# Patient Record
Sex: Male | Born: 1970 | Hispanic: No | Marital: Single | Smoking: Current every day smoker
Health system: Southern US, Community
[De-identification: ages and names within clinical notes are randomized; demographics above are authoritative.]

## PROBLEM LIST (undated history)

## (undated) DIAGNOSIS — S41039A Puncture wound without foreign body of unspecified shoulder, initial encounter: Secondary | ICD-10-CM

## (undated) DIAGNOSIS — W3400XA Accidental discharge from unspecified firearms or gun, initial encounter: Secondary | ICD-10-CM

## (undated) DIAGNOSIS — S1193XA Puncture wound without foreign body of unspecified part of neck, initial encounter: Secondary | ICD-10-CM

## (undated) DIAGNOSIS — S31139A Puncture wound of abdominal wall without foreign body, unspecified quadrant without penetration into peritoneal cavity, initial encounter: Secondary | ICD-10-CM

## (undated) DIAGNOSIS — E119 Type 2 diabetes mellitus without complications: Secondary | ICD-10-CM

## (undated) HISTORY — PX: ABDOMINAL SURGERY: SHX537

## (undated) HISTORY — PX: OTHER SURGICAL HISTORY: SHX169

---

## 2014-05-05 ENCOUNTER — Encounter (HOSPITAL_COMMUNITY): Payer: Self-pay | Admitting: *Deleted

## 2014-05-05 ENCOUNTER — Emergency Department (HOSPITAL_COMMUNITY)
Admission: EM | Admit: 2014-05-05 | Discharge: 2014-05-05 | Discharge status: Against Medical Advice | Payer: Self-pay | Attending: Emergency Medicine | Admitting: Emergency Medicine

## 2014-05-05 DIAGNOSIS — Z794 Long term (current) use of insulin: Secondary | ICD-10-CM | POA: Insufficient documentation

## 2014-05-05 DIAGNOSIS — E1165 Type 2 diabetes mellitus with hyperglycemia: Secondary | ICD-10-CM | POA: Insufficient documentation

## 2014-05-05 DIAGNOSIS — R739 Hyperglycemia, unspecified: Secondary | ICD-10-CM

## 2014-05-05 DIAGNOSIS — E860 Dehydration: Secondary | ICD-10-CM | POA: Insufficient documentation

## 2014-05-05 DIAGNOSIS — Z720 Tobacco use: Secondary | ICD-10-CM | POA: Insufficient documentation

## 2014-05-05 HISTORY — DX: Type 2 diabetes mellitus without complications: E11.9

## 2014-05-05 LAB — I-STAT VENOUS BLOOD GAS, ED
ACID-BASE EXCESS: 2 mmol/L (ref 0.0–2.0)
Bicarbonate: 27.7 mEq/L — ABNORMAL HIGH (ref 20.0–24.0)
O2 SAT: 88 %
TCO2: 29 mmol/L (ref 0–100)
pCO2, Ven: 48.2 mmHg (ref 45.0–50.0)
pH, Ven: 7.368 — ABNORMAL HIGH (ref 7.250–7.300)
pO2, Ven: 57 mmHg — ABNORMAL HIGH (ref 30.0–45.0)

## 2014-05-05 LAB — COMPREHENSIVE METABOLIC PANEL
ALT: 16 U/L — ABNORMAL LOW (ref 17–63)
AST: 19 U/L (ref 15–41)
Albumin: 3.7 g/dL (ref 3.5–5.0)
Alkaline Phosphatase: 117 U/L (ref 38–126)
Anion gap: 13 (ref 5–15)
BUN: 15 mg/dL (ref 6–20)
CO2: 22 mmol/L (ref 22–32)
CREATININE: 1.15 mg/dL (ref 0.61–1.24)
Calcium: 8.9 mg/dL (ref 8.9–10.3)
Chloride: 91 mmol/L — ABNORMAL LOW (ref 101–111)
GFR calc Af Amer: >60 mL/min (ref >60)
GFR calc non Af Amer: >60 mL/min (ref >60)
Glucose, Bld: 802 mg/dL (ref 70–99)
POTASSIUM: 4.8 mmol/L (ref 3.5–5.1)
Sodium: 126 mmol/L — ABNORMAL LOW (ref 135–145)
Total Bilirubin: 0.6 mg/dL (ref 0.3–1.2)
Total Protein: 7.2 g/dL (ref 6.5–8.1)

## 2014-05-05 LAB — CBC
HCT: 43.8 % (ref 39.0–52.0)
Hemoglobin: 14 g/dL (ref 13.0–17.0)
MCH: 24.1 pg — ABNORMAL LOW (ref 26.0–34.0)
MCHC: 32 g/dL (ref 30.0–36.0)
MCV: 75.5 fL — AB (ref 78.0–100.0)
Platelets: 156 10*3/uL (ref 150–400)
RBC: 5.8 MIL/uL (ref 4.22–5.81)
RDW: 12.7 % (ref 11.5–15.5)
WBC: 7.3 10*3/uL (ref 4.0–10.5)

## 2014-05-05 LAB — URINALYSIS, ROUTINE W REFLEX MICROSCOPIC
BILIRUBIN URINE: NEGATIVE
Hgb urine dipstick: NEGATIVE
KETONES UR: NEGATIVE mg/dL
LEUKOCYTES UA: NEGATIVE
Nitrite: NEGATIVE
PROTEIN: NEGATIVE mg/dL
Specific Gravity, Urine: 1.036 — ABNORMAL HIGH (ref 1.005–1.030)
Urobilinogen, UA: 0.2 mg/dL (ref 0.0–1.0)
pH: 5.5 (ref 5.0–8.0)

## 2014-05-05 LAB — CBG MONITORING, ED
Glucose-Capillary: >600 mg/dL (ref 70–99)
Glucose-Capillary: >600 mg/dL (ref 70–99)
Glucose-Capillary: >600 mg/dL (ref 70–99)

## 2014-05-05 LAB — URINE MICROSCOPIC-ADD ON

## 2014-05-05 MED ORDER — SODIUM CHLORIDE 0.9 % IV BOLUS (SEPSIS)
1000.0000 mL | Freq: Once | INTRAVENOUS | Status: AC
  Administered 2014-05-05: 1000 mL via INTRAVENOUS

## 2014-05-05 MED ORDER — DEXTROSE-NACL 5-0.45 % IV SOLN: INTRAVENOUS | Status: DC

## 2014-05-05 MED ORDER — SODIUM CHLORIDE 0.9 % IV BOLUS (SEPSIS): 1000.0000 mL | Freq: Once | INTRAVENOUS | Status: DC

## 2014-05-05 MED ORDER — SODIUM CHLORIDE 0.9 % IV SOLN
INTRAVENOUS | Status: DC
  Filled 2014-05-05: qty 2.5

## 2014-05-05 NOTE — ED Notes (Signed)
MD in to talk with patient

## 2014-05-05 NOTE — ED Notes (Signed)
Pt refused to sign AMA. 
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
## 2014-05-05 NOTE — ED Notes (Signed)
Pt out of insulin x 1 month.  Pt has increase in urination.

## 2014-05-05 NOTE — Care Management Note (Signed)
Case Management Note  Patient Details  Name: Scott RangerCharles Cole MRN: [id number] Date of Birth: May 02, 1970  Subjective/Objective:    Patient [facility] to [facility] ED  for evaluation of high blood sugar. He reports that he has been out of his 70/30 insulin for the last month.                Action/Plan:   1. Referral to Indigent Clinic [facility]) 2. Medication Assistance at  [facility]) insulin 70/30    Expected Discharge Date:  05/05/14               Expected Discharge Plan:  Homeless Shelter (Resources provided for local homeless shelters)  In-House Referral:     Discharge planning Services  CM Consult, Indigent Health Clinic, Medication Assistance ([facility] assistance with medications)  Post Acute Care Choice:    Choice offered to:     DME Arranged:    DME Agency:     HH Arranged:    HH Agency:     Status of Service:  Completed, signed off  Medicare Important Message Given:   n/a Date Medicare IM Given:    Medicare IM give by:    Date Additional Medicare IM Given:    Additional Medicare Important Message give by:     If discussed at Long Length of Stay Meetings, dates discussed:    Additional Comments: ED CM received consult concerning patient after  [facility] elevated glucose over 600.  According to  Patient he is homeless and lives on the street. He has not taken insulin in the last month. Provided information [facility] for tomorrow morning at 9a, he will receive medication assistance and establish care primary care there as well. No further CM needs   Michel BickersROGERS, Taylan Mayhan, RN 05/05/2014, 7:58 PM
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
## 2014-05-05 NOTE — ED Notes (Signed)
Pt woke up called out to nurse. Pt angry wanting to leave.  MD aware.

## 2014-07-10 ENCOUNTER — Emergency Department (HOSPITAL_COMMUNITY): Payer: Self-pay

## 2014-07-10 ENCOUNTER — Emergency Department (HOSPITAL_COMMUNITY)
Admission: EM | Admit: 2014-07-10 | Discharge: 2014-07-11 | Disposition: A | Discharge status: Home / Self Care | Payer: Self-pay | Attending: Emergency Medicine | Admitting: Emergency Medicine

## 2014-07-10 ENCOUNTER — Encounter (HOSPITAL_COMMUNITY): Payer: Self-pay | Admitting: Emergency Medicine

## 2014-07-10 DIAGNOSIS — Y9389 Activity, other specified: Secondary | ICD-10-CM | POA: Insufficient documentation

## 2014-07-10 DIAGNOSIS — W3400XA Accidental discharge from unspecified firearms or gun, initial encounter: Secondary | ICD-10-CM

## 2014-07-10 DIAGNOSIS — Z720 Tobacco use: Secondary | ICD-10-CM | POA: Insufficient documentation

## 2014-07-10 DIAGNOSIS — Y9289 Other specified places as the place of occurrence of the external cause: Secondary | ICD-10-CM | POA: Insufficient documentation

## 2014-07-10 DIAGNOSIS — E119 Type 2 diabetes mellitus without complications: Secondary | ICD-10-CM | POA: Insufficient documentation

## 2014-07-10 DIAGNOSIS — S42102A Fracture of unspecified part of scapula, left shoulder, initial encounter for closed fracture: Secondary | ICD-10-CM | POA: Insufficient documentation

## 2014-07-10 DIAGNOSIS — Y998 Other external cause status: Secondary | ICD-10-CM | POA: Insufficient documentation

## 2014-07-10 DIAGNOSIS — W3303XA Accidental discharge of machine gun, initial encounter: Secondary | ICD-10-CM | POA: Insufficient documentation

## 2014-07-10 HISTORY — DX: Accidental discharge from unspecified firearms or gun, initial encounter: W34.00XA

## 2014-07-10 HISTORY — DX: Puncture wound of abdominal wall without foreign body, unspecified quadrant without penetration into peritoneal cavity, initial encounter: S31.139A

## 2014-07-10 HISTORY — DX: Puncture wound without foreign body of unspecified part of neck, initial encounter: S11.93XA

## 2014-07-10 HISTORY — DX: Puncture wound without foreign body of unspecified shoulder, initial encounter: S41.039A

## 2014-07-10 LAB — PREPARE FRESH FROZEN PLASMA
UNIT DIVISION: 0
Unit division: 0

## 2014-07-10 LAB — ETHANOL: Alcohol, Ethyl (B): <5 mg/dL (ref <5)

## 2014-07-10 LAB — COMPREHENSIVE METABOLIC PANEL
ALK PHOS: 90 U/L (ref 38–126)
ALT: 13 U/L — AB (ref 17–63)
AST: 30 U/L (ref 15–41)
Albumin: 3.8 g/dL (ref 3.5–5.0)
Anion gap: 18 — ABNORMAL HIGH (ref 5–15)
BILIRUBIN TOTAL: 0.6 mg/dL (ref 0.3–1.2)
BUN: 9 mg/dL (ref 6–20)
CHLORIDE: 101 mmol/L (ref 101–111)
CO2: 15 mmol/L — AB (ref 22–32)
Calcium: 9.2 mg/dL (ref 8.9–10.3)
Creatinine, Ser: 1.42 mg/dL — ABNORMAL HIGH (ref 0.61–1.24)
GFR, EST NON AFRICAN AMERICAN: 59 mL/min — AB (ref >60)
Glucose, Bld: 466 mg/dL — ABNORMAL HIGH (ref 65–99)
Potassium: 3.9 mmol/L (ref 3.5–5.1)
SODIUM: 134 mmol/L — AB (ref 135–145)
Total Protein: 7 g/dL (ref 6.5–8.1)

## 2014-07-10 LAB — I-STAT CHEM 8, ED
BUN: 11 mg/dL (ref 6–20)
CHLORIDE: 101 mmol/L (ref 101–111)
CREATININE: 1 mg/dL (ref 0.61–1.24)
Calcium, Ion: 1.19 mmol/L (ref 1.12–1.23)
Glucose, Bld: 494 mg/dL — ABNORMAL HIGH (ref 65–99)
HCT: 47 % (ref 39.0–52.0)
Hemoglobin: 16 g/dL (ref 13.0–17.0)
Potassium: 3.8 mmol/L (ref 3.5–5.1)
SODIUM: 137 mmol/L (ref 135–145)
TCO2: 16 mmol/L (ref 0–100)

## 2014-07-10 LAB — I-STAT CG4 LACTIC ACID, ED: Lactic Acid, Venous: 9.48 mmol/L (ref 0.5–2.0)

## 2014-07-10 LAB — CBC
HCT: 40.9 % (ref 39.0–52.0)
Hemoglobin: 12.3 g/dL — ABNORMAL LOW (ref 13.0–17.0)
MCH: 23.7 pg — ABNORMAL LOW (ref 26.0–34.0)
MCHC: 30.1 g/dL (ref 30.0–36.0)
MCV: 78.8 fL (ref 78.0–100.0)
PLATELETS: 188 10*3/uL (ref 150–400)
RBC: 5.19 MIL/uL (ref 4.22–5.81)
RDW: 12.9 % (ref 11.5–15.5)
WBC: 10.4 10*3/uL (ref 4.0–10.5)

## 2014-07-10 LAB — PROTIME-INR
INR: 1.04 (ref 0.00–1.49)
Prothrombin Time: 13.8 seconds (ref 11.6–15.2)

## 2014-07-10 LAB — CDS SEROLOGY

## 2014-07-10 MED ORDER — CEPHALEXIN 250 MG PO CAPS
500.0000 mg | ORAL_CAPSULE | Freq: Once | ORAL | Status: AC
  Administered 2014-07-10: 500 mg via ORAL
  Filled 2014-07-10: qty 2

## 2014-07-10 MED ORDER — HYDROCODONE-ACETAMINOPHEN 5-325 MG PO TABS: 1.0000 | ORAL_TABLET | ORAL | Status: AC | PRN

## 2014-07-10 MED ORDER — SODIUM CHLORIDE 0.9 % IV SOLN
INTRAVENOUS | Status: AC | PRN
  Administered 2014-07-10: 30 mL/h via INTRAVENOUS
  Administered 2014-07-10: 1000 mL via INTRAVENOUS

## 2014-07-10 MED ORDER — CEPHALEXIN 500 MG PO CAPS: 500.0000 mg | ORAL_CAPSULE | Freq: Three times a day (TID) | ORAL | Status: AC

## 2014-07-10 MED ORDER — IOHEXOL 350 MG/ML SOLN
100.0000 mL | Freq: Once | INTRAVENOUS | Status: AC | PRN
  Administered 2014-07-10: 100 mL via INTRAVENOUS

## 2014-07-10 NOTE — ED Notes (Signed)
Transported to CT with RN and EDP
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
## 2014-07-11 ENCOUNTER — Encounter (HOSPITAL_COMMUNITY): Payer: Self-pay | Admitting: Emergency Medicine

## 2014-07-11 LAB — TYPE AND SCREEN
ABO/RH(D): A POS
ANTIBODY SCREEN: NEGATIVE
UNIT DIVISION: 0
Unit division: 0

## 2014-07-11 LAB — ABO/RH: ABO/RH(D): A POS

## 2014-07-11 LAB — CBG MONITORING, ED: Glucose-Capillary: 417 mg/dL — ABNORMAL HIGH (ref 65–99)

## 2014-07-11 NOTE — ED Notes (Signed)
Pt removing [place] and will not leave it on after explaining the reasoning to the pt.

## 2015-06-09 ENCOUNTER — Encounter (HOSPITAL_COMMUNITY): Payer: Self-pay | Admitting: *Deleted
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
## 2023-11-03 DEATH — deceased
